# Patient Record
Sex: Female | Born: 1995 | Race: White | Hispanic: No | Marital: Single | Smoking: Never smoker
Health system: Southern US, Community
[De-identification: ages and names within clinical notes are randomized; demographics above are authoritative.]

---
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
## 2015-05-21 ENCOUNTER — Telehealth: Payer: Self-pay | Admitting: Obstetrics and Gynecology

## 2015-05-21 NOTE — Telephone Encounter (Signed)
[facility], Beverlie's  mother called.Marland KitchenMarland KitchenTaylor knot on the left side of her groin area, a lot of pain with intercourse, pain on that side radiating around to er back.

## 2015-05-21 NOTE — Telephone Encounter (Signed)
Needs to be seen

## 2015-05-21 NOTE — Telephone Encounter (Signed)
pls advise

## 2015-05-22 ENCOUNTER — Encounter: Payer: Self-pay | Admitting: Obstetrics and Gynecology

## 2015-05-22 ENCOUNTER — Ambulatory Visit (INDEPENDENT_AMBULATORY_CARE_PROVIDER_SITE_OTHER): Payer: BLUE CROSS/BLUE SHIELD | Admitting: Obstetrics and Gynecology

## 2015-05-22 VITALS — BP 111/67 | HR 66 | Ht 64.0 in | Wt 136.6 lb

## 2015-05-22 DIAGNOSIS — B379 Candidiasis, unspecified: Secondary | ICD-10-CM

## 2015-05-22 DIAGNOSIS — R599 Enlarged lymph nodes, unspecified: Secondary | ICD-10-CM | POA: Diagnosis not present

## 2015-05-22 DIAGNOSIS — R590 Localized enlarged lymph nodes: Secondary | ICD-10-CM

## 2015-05-22 MED ORDER — FLUCONAZOLE 150 MG PO TABS: 150.0000 mg | ORAL_TABLET | Freq: Once | ORAL | Status: DC

## 2015-05-22 NOTE — Telephone Encounter (Signed)
Left message pt needs to be seen

## 2015-05-22 NOTE — Progress Notes (Signed)
Subjective:     Patient ID: Heather Wyatt, female   DOB: 05/09/1996, 19 y.o.   MRN: [id number]  HPI Felt a lump in left groin about 2 weeks ago, not painful and not changing in size. Had a UTI about 5 weeks ago.  Review of Systems Lump in left groin- not painful Occasional left pelvic pains during intercourse, desnies bleeding, same partner.    Objective:   Physical Exam A&O X4 Well groomed female in no distress + enlarged lymph node in left groin- non-tender and non-mobile Pelvic exam: normal external genitalia, vulva, vagina, cervix, uterus and adnexa, WET MOUNT done - results: negative for pathogens, normal epithelial cells, lactobacilli.    Assessment:     monial vaginitis Left groin lymphadenopathy     Plan:     Diflucan  x 1 dose Will recheck lymph node at [facility] that is scheduled in Oct. Or sooner if it increases in size.  Heather Wyatt, CNM

## 2015-05-22 NOTE — Patient Instructions (Signed)
Lymphadenopathy Lymphadenopathy means "disease of the lymph glands." But the term is usually used to describe swollen or enlarged lymph glands, also called lymph nodes. These are the bean-shaped organs found in many locations including the neck, underarm, and groin. Lymph glands are part of the immune system, which fights infections in your body. Lymphadenopathy can occur in just one area of the body, such as the neck, or it can be generalized, with lymph node enlargement in several areas. The nodes found in the neck are the most common sites of lymphadenopathy. CAUSES When your immune system responds to germs (such as viruses or bacteria ), infection-fighting cells and fluid build up. This causes the glands to grow in size. Usually, this is not something to worry about. Sometimes, the glands themselves can become infected and inflamed. This is called lymphadenitis. Enlarged lymph nodes can be caused by many diseases:  Bacterial disease, such as strep throat or a skin infection.  Viral disease, such as a common cold.  Other germs, such as Lyme disease, tuberculosis, or sexually transmitted diseases.  Cancers, such as lymphoma (cancer of the lymphatic system) or leukemia (cancer of the white blood cells).  Inflammatory diseases such as lupus or rheumatoid arthritis.  Reactions to medications. Many of the diseases above are rare, but important. This is why you should see your caregiver if you have lymphadenopathy. SYMPTOMS  Swollen, enlarged lumps in the neck, back of the head, or other locations.  Tenderness.  Warmth or redness of the skin over the lymph nodes.  Fever. DIAGNOSIS Enlarged lymph nodes are often near the source of infection. They can help health care providers diagnose your illness. For instance:  Swollen lymph nodes around the jaw might be caused by an infection in the mouth.  Enlarged glands in the neck often signal a throat infection.  Lymph nodes that are swollen  in more than one area often indicate an illness caused by a virus. Your caregiver will likely know what is causing your lymphadenopathy after listening to your history and examining you. Blood tests, x-rays, or other tests may be needed. If the cause of the enlarged lymph node cannot be found, and it does not go away by itself, then a biopsy may be needed. Your caregiver will discuss this with you. TREATMENT Treatment for your enlarged lymph nodes will depend on the cause. Many times the nodes will shrink to normal size by themselves, with no treatment. Antibiotics or other medicines may be needed for infection. Only take over-the-counter or prescription medicines for pain, discomfort, or fever as directed by your caregiver. HOME CARE INSTRUCTIONS Swollen lymph glands usually return to normal when the underlying medical condition goes away. If they persist, contact your health-care provider. He/she might prescribe antibiotics or other treatments, depending on the diagnosis. Take any medications exactly as prescribed. Keep any follow-up appointments made to check on the condition of your enlarged nodes. SEEK MEDICAL CARE IF:  Swelling lasts for more than two weeks.  You have symptoms such as weight loss, night sweats, fatigue, or fever that does not go away.  The lymph nodes are hard, seem fixed to the skin, or are growing rapidly.  Skin over the lymph nodes is red and inflamed. This could mean there is an infection. SEEK IMMEDIATE MEDICAL CARE IF:  Fluid starts leaking from the area of the enlarged lymph node.  You develop a fever of 102 F (38.9 C) or greater.  Severe pain develops (not necessarily at the site of a  large lymph node).  You develop chest pain or shortness of breath.  You develop worsening abdominal pain. MAKE SURE YOU:  Understand these instructions.  Will watch your condition.  Will get help right away if you are not doing well or get worse. Document Released:  05/24/2008 Document Revised: 12/30/2013 Document Reviewed: 05/24/2008 [facility] Patient Information 2015 [place], [place]. This information is not intended to replace advice given to you by your health care provider. Make sure you discuss any questions you have with your health care provider. Candida Infection A Candida infection (also called yeast, fungus, and Monilia infection) is an overgrowth of yeast that can occur anywhere on the body. A yeast infection commonly occurs in warm, moist body areas. Usually, the infection remains localized but can spread to become a systemic infection. A yeast infection may be a sign of a more severe disease such as diabetes, leukemia, or AIDS. A yeast infection can occur in both men and women. In women, Candida vaginitis is a vaginal infection. It is one of the most common causes of vaginitis. Men usually do not have symptoms or know they have an infection until other problems develop. Men may find out they have a yeast infection because their sex partner has a yeast infection. Uncircumcised men are more likely to get a yeast infection than circumcised men. This is because the uncircumcised glans is not exposed to air and does not remain as dry as that of a circumcised glans. Older adults may develop yeast infections around dentures. CAUSES  Women  Antibiotics.  Steroid medication taken for a long time.  Being overweight (obese).  Diabetes.  Poor immune condition.  Certain serious medical conditions.  Immune suppressive medications for organ transplant patients.  Chemotherapy.  Pregnancy.  Menstruation.  Stress and fatigue.  Intravenous drug use.  Oral contraceptives.  Wearing tight-fitting clothes in the crotch area.  Catching it from a sex partner who has a yeast infection.  Spermicide.  Intravenous, urinary, or other catheters. Men  Catching it from a sex partner who has a yeast infection.  Having oral or anal sex with a person who  has the infection.  Spermicide.  Diabetes.  Antibiotics.  Poor immune system.  Medications that suppress the immune system.  Intravenous drug use.  Intravenous, urinary, or other catheters. SYMPTOMS  Women  Thick, white vaginal discharge.  Vaginal itching.  Redness and swelling in and around the vagina.  Irritation of the lips of the vagina and perineum.  Blisters on the vaginal lips and perineum.  Painful sexual intercourse.  Low blood sugar (hypoglycemia).  Painful urination.  Bladder infections.  Intestinal problems such as constipation, indigestion, bad breath, bloating, increase in gas, diarrhea, or loose stools. Men  Men may develop intestinal problems such as constipation, indigestion, bad breath, bloating, increase in gas, diarrhea, or loose stools.  Dry, cracked skin on the penis with itching or discomfort.  Jock itch.  Dry, flaky skin.  Athlete's foot.  Hypoglycemia. DIAGNOSIS  Women  A history and an exam are performed.  The discharge may be examined under a microscope.  A culture may be taken of the discharge. Men  A history and an exam are performed.  Any discharge from the penis or areas of cracked skin will be looked at under the microscope and cultured.  Stool samples may be cultured. TREATMENT  Women  Vaginal antifungal suppositories and creams.  Medicated creams to decrease irritation and itching on the outside of the vagina.  Warm compresses to  the perineal area to decrease swelling and discomfort.  Oral antifungal medications.  Medicated vaginal suppositories or cream for repeated or recurrent infections.  Wash and dry the irritation areas before applying the cream.  Eating yogurt with Lactobacillus may help with prevention and treatment.  Sometimes painting the vagina with gentian violet solution may help if creams and suppositories do not work. Men  Antifungal creams and oral antifungal  medications.  Sometimes treatment must continue for 30 days after the symptoms go away to prevent recurrence. HOME CARE INSTRUCTIONS  Women  Use cotton underwear and avoid tight-fitting clothing.  Avoid colored, scented toilet paper and deodorant tampons or pads.  Do not douche.  Keep your diabetes under control.  Finish all the prescribed medications.  Keep your skin clean and dry.  Consume milk or yogurt with Lactobacillus-active culture regularly. If you get frequent yeast infections and think that is what the infection is, there are over-the-counter medications that you can get. If the infection does not show healing in 3 days, talk to your caregiver.  Tell your sex partner you have a yeast infection. Your partner may need treatment also, especially if your infection does not clear up or recurs. Men  Keep your skin clean and dry.  Keep your diabetes under control.  Finish all prescribed medications.  Tell your sex partner that you have a yeast infection so he or she can be treated if necessary. SEEK MEDICAL CARE IF:   Your symptoms do not clear up or worsen in one week after treatment.  You have an oral temperature above 102 F (38.9 C).  You have trouble swallowing or eating for a prolonged time.  You develop blisters on and around your vagina.  You develop vaginal bleeding and it is not your menstrual period.  You develop abdominal pain.  You develop intestinal problems as mentioned above.  You get weak or light-headed.  You have painful or increased urination.  You have pain during sexual intercourse. MAKE SURE YOU:   Understand these instructions.  Will watch your condition.  Will get help right away if you are not doing well or get worse. Document Released: 09/22/2004 Document Revised: 12/30/2013 Document Reviewed: 01/04/2010 [facility] Patient Information 2015 [place], [place]. This information is not intended to replace advice given to you by your  health care provider. Make sure you discuss any questions you have with your health care provider.

## 2015-06-26 ENCOUNTER — Encounter: Payer: Self-pay | Admitting: Obstetrics and Gynecology

## 2015-06-26 ENCOUNTER — Ambulatory Visit: Payer: BLUE CROSS/BLUE SHIELD | Admitting: Obstetrics and Gynecology

## 2015-06-26 VITALS — BP 100/56 | HR 70 | Ht 64.0 in | Wt 135.1 lb

## 2015-06-26 DIAGNOSIS — Z01419 Encounter for gynecological examination (general) (routine) without abnormal findings: Secondary | ICD-10-CM

## 2015-06-26 NOTE — Progress Notes (Incomplete)
SUBJECTIVE:  19 y.o. female for annual well woman exam and checkup. Follow up on left inguinal LN - patient reports it is unchanged, nontender Hx of yeast infections: Denies any vaginal itching or discharge Sexually active, monogamous, no new partners  Contraception: Lo Lo Estrin, wants to continue it, no side effects. Cycles are regular, 28 day cycle with 4 days of minor bleeding. Exercises 2x/week, healthy diet Does self breast exams every few months   Current Outpatient Prescriptions  Medication Sig Dispense Refill  . norethindrone-ethinyl estradiol (JUNEL FE,GILDESS FE,LOESTRIN FE) 1-20 MG-MCG tablet Take 1 tablet by mouth daily.    . fluconazole (DIFLUCAN) 150 MG tablet Take 1 tablet (150 mg total) by mouth once. Can take additional dose three days later if symptoms persist (Patient not taking: Reported on 06/26/2015) 1 tablet 3   No current facility-administered medications for this visit.   Allergies: Review of patient's allergies indicates no known allergies.  Patient's last menstrual period was 06/07/2015.  ROS:  Feeling well. No dyspnea or chest pain on exertion.  No abdominal pain, change in bowel habits, black or bloody stools.  No urinary tract symptoms.  GYN ROS: normal menses, no abnormal bleeding, pelvic pain or discharge, no breast pain or new or enlarging lumps on self exam, no side effects of hormonal medications, no vaginal bleeding, no discharge or pelvic pain, cyclic withdrawal menses only. No neurological complaints.  OBJECTIVE:  The patient appears well, alert, oriented x 3, in no distress. BP 100/56 mmHg  Pulse 70  Ht 5\' 4"  (1.626 m)  Wt 135 lb 1.6 oz (61.281 kg)  BMI 23.18 kg/m2  LMP 06/07/2015 ENT normal.  Neck supple. No adenopathy or thyromegaly. PERLA. Lungs are clear, good air entry, no wheezes, rhonchi or rales. S1 and S2 normal, no murmurs, regular rate and rhythm. Abdomen soft without tenderness, guarding, mass or organomegaly. Extremities show no  edema, normal peripheral pulses. Neurological is normal, no focal findings.  BREAST EXAM: breasts appear normal, no suspicious masses, no skin or nipple changes or axillary nodes  PELVIC EXAM: normal external genitalia, vulva, external RV.  Cervix not examined Left inguinal LN <1cm, mobile, nontender. Stable since last exam.  ASSESSMENT:  Well woman exam, 19yo No contraindication to continue use of oral contraceptives Sexually active on OCP  PLAN:  Additional lab tests per orders - GC swab done Return annually or PRN Counseled on self breast exams, regular exercise, healthy eating habits.  Continue monitoring left inguinal LN for changes. Refill of Lo Lo Estrin sent   Doreene NestElena Amandalee Lacap, PA-S

## 2015-06-26 NOTE — Progress Notes (Unsigned)
SUBJECTIVE:  19 y.o. female for annual well woman exam and checkup. Follow up on left inguinal LN - patient reports it is unchanged, nontender Hx of yeast infections: Denies any vaginal itching or discharge Sexually active, monogamous, no new partners  Contraception: Lo Lo Estrin, wants to continue it, no side effects. Cycles are regular, 28 day cycle with 4 days of minor bleeding. Exercises 2x/week, healthy diet Does self breast exams every few months   Current Outpatient Prescriptions  Medication Sig Dispense Refill  . norethindrone-ethinyl estradiol (JUNEL FE,GILDESS FE,LOESTRIN FE) 1-20 MG-MCG tablet Take 1 tablet by mouth daily.    . fluconazole (DIFLUCAN) 150 MG tablet Take 1 tablet (150 mg total) by mouth once. Can take additional dose three days later if symptoms persist (Patient not taking: Reported on 06/26/2015) 1 tablet 3   No current facility-administered medications for this visit.   Allergies: Review of patient's allergies indicates no known allergies.  Patient's last menstrual period was 06/07/2015.  ROS:  Feeling well. No dyspnea or chest pain on exertion.  No abdominal pain, change in bowel habits, black or bloody stools.  No urinary tract symptoms.  GYN ROS: normal menses, no abnormal bleeding, pelvic pain or discharge, no breast pain or new or enlarging lumps on self exam, no side effects of hormonal medications, no vaginal bleeding, no discharge or pelvic pain, cyclic withdrawal menses only. No neurological complaints.  OBJECTIVE:  The patient appears well, alert, oriented x 3, in no distress. BP 100/56 mmHg  Pulse 70  Ht 5\' 4"  (1.626 m)  Wt 135 lb 1.6 oz (61.281 kg)  BMI 23.18 kg/m2  LMP 06/07/2015 ENT normal.  Neck supple. No adenopathy or thyromegaly. PERLA. Lungs are clear, good air entry, no wheezes, rhonchi or rales. S1 and S2 normal, no murmurs, regular rate and rhythm. Abdomen soft without tenderness, guarding, mass or organomegaly. Extremities show no  edema, normal peripheral pulses. Neurological is normal, no focal findings.  BREAST EXAM: breasts appear normal, no suspicious masses, no skin or nipple changes or axillary nodes  PELVIC EXAM: normal external genitalia, vulva, external RV.  Cervix not examined Left inguinal LN <1cm, mobile, nontender. Stable since last exam.  ASSESSMENT:  Well woman exam, 19yo No contraindication to continue use of oral contraceptives Sexually active on OCP  PLAN:  Additional lab tests per orders - GC swab done Return annually or PRN Counseled on self breast exams, regular exercise, healthy eating habits.  Continue monitoring left inguinal LN for changes. Refill of Lo Lo Estrin sent   Doreene NestElena Klaus, PA-S Melody CampbellBurr, CNM

## 2015-06-26 NOTE — Patient Instructions (Addendum)
1. Normal pelvic exam today 2. Gonorrhea/chlamydia swab done today, patient will be called with results 3. Continue self breast exams, exercise, healthy eating 4. Refill of Lo Lo Estrin sent to your pharmacy 5. Follow up in 1 year for annual exam  Thank you for enrolling in MyChart. Please follow the instructions below to securely access your online medical record. MyChart allows you to send messages to your doctor, view your test results, manage appointments, and more.   How Do I Sign Up? 1. In your Internet browser, go to [employer] and enter [email or web address]. 2. Click on the Sign Up Now link in the Sign In box. You will see the New Member Sign Up page. 3. Enter your MyChart Access Code exactly as it appears below. You will not need to use this code after you've completed the sign-up process. If you do not sign up before the expiration date, you must request a new code.  MyChart Access Code: [email or web address] Expires: 08/07/2015  3:06 AM  4. Enter your Social Security Number ([id number]) and Date of Birth (mm/dd/yyyy) as indicated and click Submit. You will be taken to the next sign-up page. 5. Create a MyChart ID. This will be your MyChart login ID and cannot be changed, so think of one that is secure and easy to remember. 6. Create a MyChart password. You can change your password at any time. 7. Enter your Password Reset Question and Answer. This can be used at a later time if you forget your password.  8. Enter your e-mail address. You will receive e-mail notification when new information is available in MyChart. 9. Click Sign Up. You can now view your medical record.   Additional Information Remember, MyChart is NOT to be used for urgent needs. For medical emergencies, dial 911.

## 2015-07-06 ENCOUNTER — Encounter: Payer: Self-pay | Admitting: Obstetrics and Gynecology

## 2015-10-15 ENCOUNTER — Other Ambulatory Visit: Payer: Self-pay | Admitting: *Deleted

## 2015-10-19 ENCOUNTER — Other Ambulatory Visit: Payer: Self-pay | Admitting: *Deleted

## 2015-10-19 MED ORDER — NORETHIN ACE-ETH ESTRAD-FE 1.5-30 MG-MCG PO TABS: 1.0000 | ORAL_TABLET | Freq: Every day | ORAL | Status: DC

## 2016-05-03 ENCOUNTER — Other Ambulatory Visit: Payer: Self-pay | Admitting: *Deleted

## 2016-05-03 ENCOUNTER — Telehealth: Payer: Self-pay | Admitting: Obstetrics and Gynecology

## 2016-05-03 MED ORDER — NORETHIN-ETH ESTRAD-FE BIPHAS 1 MG-10 MCG / 10 MCG PO TABS: 1.0000 | ORAL_TABLET | Freq: Every day | ORAL | 11 refills | Status: DC

## 2016-05-03 NOTE — Telephone Encounter (Signed)
Sent in Lo Loestrin Fe 1/10 to [facility], left detailed message for pt

## 2016-05-03 NOTE — Telephone Encounter (Signed)
PT CALLED AND HSE WOULD LIKE TO GO BACK TO HER OLD BIRTH CONTROL SHE SAID THE LOLO, SHE WOULD ALSO LIKE HER PH[facility]AC[facility]. PT WOULD LIKE A CALL WHEN DONE

## 2016-07-01 ENCOUNTER — Ambulatory Visit (INDEPENDENT_AMBULATORY_CARE_PROVIDER_SITE_OTHER): Payer: 59 | Admitting: Obstetrics and Gynecology

## 2016-07-01 ENCOUNTER — Encounter: Payer: Self-pay | Admitting: Obstetrics and Gynecology

## 2016-07-01 VITALS — BP 120/72 | HR 78 | Ht 64.0 in | Wt 146.4 lb

## 2016-07-01 DIAGNOSIS — Z01419 Encounter for gynecological examination (general) (routine) without abnormal findings: Secondary | ICD-10-CM

## 2016-07-01 NOTE — Patient Instructions (Signed)
Place annual gynecologic exam patient instructions here.

## 2016-07-01 NOTE — Progress Notes (Signed)
Subjective:     Heather Wyatt is a 20 y.o. female and is here for a comprehensive physical exam. The patient reports no problems. Advised that new OCP is working much better and her periods are much lighter.   Needs Flu Vaccine  Social smoker and uses ETOH socially as well. Works at [employer] and attends [employer]. Transferring to [employer] in January. Denies vaginal discharge, breakthrough bleeding, painful sex or any other complaints.  Social History   Social History  . Marital status: Single    Spouse name: N/A  . Number of children: N/A  . Years of education: N/A   Occupational History  . Not on file.   Social History Main Topics  . Smoking status: Never Smoker  . Smokeless tobacco: Never Used  . Alcohol use No  . Drug use: No  . Sexual activity: Yes    Birth control/ protection: Pill   Other Topics Concern  . Not on file   Social History Narrative  . No narrative on file   Health Maintenance  Topic Date Due  . CHLAMYDIA SCREENING  04/22/2011  . HIV Screening  04/22/2011  . TETANUS/TDAP  04/22/2015  . INFLUENZA VACCINE  03/29/2016    The following portions of the patient's history were reviewed and updated as appropriate: allergies, current medications, past family history, past medical history, past social history, past surgical history and problem list.  Review of Systems Pertinent items noted in HPI and remainder of comprehensive ROS otherwise negative.   Objective:    BP 120/72   Pulse 78   Ht 5\' 4"  (1.626 m)   Wt 146 lb 6.4 oz (66.4 kg)   LMP 06/04/2016   BMI 25.13 kg/m  General appearance: alert, cooperative and appears older than stated age Head: Normocephalic, without obvious abnormality, atraumatic Throat: lips, mucosa, and tongue normal; teeth and gums normal Neck: no adenopathy, no carotid bruit, no JVD, supple, symmetrical, trachea midline and thyroid not enlarged, symmetric, no tenderness/mass/nodules Back: symmetric, no curvature. ROM normal. No CVA  tenderness. Lungs: clear to auscultation bilaterally Breasts: normal appearance, no masses or tenderness Heart: regular rate and rhythm, S1, S2 normal, no murmur, click, rub or gallop Abdomen: soft, non-tender; bowel sounds normal; no masses,  no organomegaly Pelvic: cervix normal in appearance, external genitalia normal, no adnexal masses or tenderness, no cervical motion tenderness, rectovaginal septum normal, uterus normal size, shape, and consistency and vagina normal without discharge Extremities: extremities normal, atraumatic, no cyanosis or edema Pulses: 2+ and symmetric Skin: Skin color, texture, turgor normal. No rashes or lesions Neurologic: Grossly normal    Assessment:    Healthy female exam.    OCP user    Plan:  Flu vaccine given. Follow up in 1 year or sooner if needed. Patient changing her annual to December so it falls over winter break for school. May need OCP refill prior.   See After Visit Summary for Counseling Recommendations

## 2016-11-11 DIAGNOSIS — J019 Acute sinusitis, unspecified: Secondary | ICD-10-CM | POA: Diagnosis not present

## 2016-11-11 DIAGNOSIS — J029 Acute pharyngitis, unspecified: Secondary | ICD-10-CM | POA: Diagnosis not present

## 2017-02-14 DIAGNOSIS — L508 Other urticaria: Secondary | ICD-10-CM | POA: Diagnosis not present

## 2017-03-14 DIAGNOSIS — L508 Other urticaria: Secondary | ICD-10-CM | POA: Diagnosis not present

## 2017-04-07 ENCOUNTER — Other Ambulatory Visit: Payer: Self-pay | Admitting: Obstetrics and Gynecology

## 2017-04-07 ENCOUNTER — Telehealth: Payer: Self-pay | Admitting: Obstetrics and Gynecology

## 2017-08-16 ENCOUNTER — Encounter: Payer: Self-pay | Admitting: Obstetrics and Gynecology

## 2017-08-16 ENCOUNTER — Encounter: Payer: 59 | Admitting: Obstetrics and Gynecology

## 2017-08-16 ENCOUNTER — Other Ambulatory Visit: Payer: Self-pay | Admitting: Obstetrics and Gynecology

## 2017-08-16 ENCOUNTER — Ambulatory Visit (INDEPENDENT_AMBULATORY_CARE_PROVIDER_SITE_OTHER): Payer: 59 | Admitting: Obstetrics and Gynecology

## 2017-08-16 VITALS — BP 123/74 | HR 75 | Ht 64.0 in | Wt 142.5 lb

## 2017-08-16 DIAGNOSIS — Z01411 Encounter for gynecological examination (general) (routine) with abnormal findings: Secondary | ICD-10-CM | POA: Diagnosis not present

## 2017-08-16 DIAGNOSIS — N9089 Other specified noninflammatory disorders of vulva and perineum: Secondary | ICD-10-CM | POA: Diagnosis not present

## 2017-08-16 DIAGNOSIS — Z01419 Encounter for gynecological examination (general) (routine) without abnormal findings: Secondary | ICD-10-CM | POA: Diagnosis not present

## 2017-08-16 DIAGNOSIS — R300 Dysuria: Secondary | ICD-10-CM | POA: Diagnosis not present

## 2017-08-16 LAB — POCT URINALYSIS DIPSTICK
BILIRUBIN UA: NEGATIVE
GLUCOSE UA: NEGATIVE
KETONES UA: NEGATIVE
Leukocytes, UA: NEGATIVE
Nitrite, UA: POSITIVE
Protein, UA: NEGATIVE
RBC UA: NEGATIVE
SPEC GRAV UA: 1.015 (ref 1.010–1.025)
Urobilinogen, UA: 0.2 E.U./dL
pH, UA: 6 (ref 5.0–8.0)

## 2017-08-16 MED ORDER — LIDOCAINE HCL 2 % EX GEL: 1.0000 "application " | CUTANEOUS | 2 refills | Status: AC | PRN

## 2017-08-16 MED ORDER — NITROFURANTOIN MONOHYD MACRO 100 MG PO CAPS: 100.0000 mg | ORAL_CAPSULE | Freq: Two times a day (BID) | ORAL | 1 refills | Status: AC

## 2017-08-16 MED ORDER — NORETHIN-ETH ESTRAD-FE BIPHAS 1 MG-10 MCG / 10 MCG PO TABS: 1.0000 | ORAL_TABLET | Freq: Every day | ORAL | 6 refills | Status: DC

## 2017-08-16 NOTE — Patient Instructions (Signed)
Preventive Care 18-39 Years, Female Preventive care refers to lifestyle choices and visits with your health care provider that can promote health and wellness. What does preventive care include?  A yearly physical exam. This is also called an annual well check.  Dental exams once or twice a year.  Routine eye exams. Ask your health care provider how often you should have your eyes checked.  Personal lifestyle choices, including: ? Daily care of your teeth and gums. ? Regular physical activity. ? Eating a healthy diet. ? Avoiding tobacco and drug use. ? Limiting alcohol use. ? Practicing safe sex. ? Taking vitamin and mineral supplements as recommended by your health care provider. What happens during an annual well check? The services and screenings done by your health care provider during your annual well check will depend on your age, overall health, lifestyle risk factors, and family history of disease. Counseling Your health care provider may ask you questions about your:  Alcohol use.  Tobacco use.  Drug use.  Emotional well-being.  Home and relationship well-being.  Sexual activity.  Eating habits.  Work and work Statistician.  Method of birth control.  Menstrual cycle.  Pregnancy history.  Screening You may have the following tests or measurements:  Height, weight, and BMI.  Diabetes screening. This is done by checking your blood sugar (glucose) after you have not eaten for a while (fasting).  Blood pressure.  Lipid and cholesterol levels. These may be checked every 5 years starting at age 38.  Skin check.  Hepatitis C blood test.  Hepatitis B blood test.  Sexually transmitted disease (STD) testing.  BRCA-related cancer screening. This may be done if you have a family history of breast, ovarian, tubal, or peritoneal cancers.  Pelvic exam and Pap test. This may be done every 3 years starting at age 38. Starting at age 30, this may be done  every 5 years if you have a Pap test in combination with an HPV test.  Discuss your test results, treatment options, and if necessary, the need for more tests with your health care provider. Vaccines Your health care provider may recommend certain vaccines, such as:  Influenza vaccine. This is recommended every year.  Tetanus, diphtheria, and acellular pertussis (Tdap, Td) vaccine. You may need a Td booster every 10 years.  Varicella vaccine. You may need this if you have not been vaccinated.  HPV vaccine. If you are 39 or younger, you may need three doses over 6 months.  Measles, mumps, and rubella (MMR) vaccine. You may need at least one dose of MMR. You may also need a second dose.  Pneumococcal 13-valent conjugate (PCV13) vaccine. You may need this if you have certain conditions and were not previously vaccinated.  Pneumococcal polysaccharide (PPSV23) vaccine. You may need one or two doses if you smoke cigarettes or if you have certain conditions.  Meningococcal vaccine. One dose is recommended if you are age 68-21 years and a first-year college student living in a residence hall, or if you have one of several medical conditions. You may also need additional booster doses.  Hepatitis A vaccine. You may need this if you have certain conditions or if you travel or work in places where you may be exposed to hepatitis A.  Hepatitis B vaccine. You may need this if you have certain conditions or if you travel or work in places where you may be exposed to hepatitis B.  Haemophilus influenzae type b (Hib) vaccine. You may need this  if you have certain risk factors.  Talk to your health care provider about which screenings and vaccines you need and how often you need them. This information is not intended to replace advice given to you by your health care provider. Make sure you discuss any questions you have with your health care provider. Document Released: 10/11/2001 Document Revised:  05/04/2016 Document Reviewed: 06/16/2015 Elsevier Interactive Patient Education  2018 Elsevier Inc.  

## 2017-08-16 NOTE — Progress Notes (Signed)
ANNUAL PREVENTATIVE CARE GYN  ENCOUNTER NOTE  Subjective:       Heather Wyatt is a single white 21 y.o. G0P0000 female here for a routine annual gynecologic exam. 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 Exercising 3-4 days a week. Needs flu vaccine.  Current complaints: burning at top of vulva x 1 week with urination increased. Does report new sexual partner for 3 months.    Gynecologic History Patient's last menstrual period was 07/08/2017. Contraception: OCP (estrogen/progesterone)   Obstetric History OB History  Gravida Para Term Preterm AB Living  0 0 0 0 0 0  SAB TAB Ectopic Multiple Live Births  0 0 0 0 0        History reviewed. No pertinent past medical history.  History reviewed. No pertinent surgical history.  Current Outpatient Medications on File Prior to Visit  Medication Sig Dispense Refill  . LO LOESTRIN FE 1 MG-10 MCG / 10 MCG tablet TAKE 1 TABLET BY MOUTH DAILY 28 tablet 6   No current facility-administered medications on file prior to visit.     No Known Allergies  Social History   Socioeconomic History  . Marital status: Single    Spouse name: Not on file  . Number of children: Not on file  . Years of education: Not on file  . Highest education level: Not on file  Social Needs  . Financial resource strain: Not on file  . Food insecurity - worry: Not on file  . Food insecurity - inability: Not on file  . Transportation needs - medical: Not on file  . Transportation needs - non-medical: Not on file  Occupational History  . Not on file  Tobacco Use  . Smoking status: Never Smoker  . Smokeless tobacco: Never Used  Substance and Sexual Activity  . Alcohol use: No  . Drug use: No  . Sexual activity: Yes    Birth control/protection: Pill  Other Topics Concern  . Not on file  Social History Narrative  . Not on file    History reviewed. No pertinent family history.  The following portions of the patient's history were  reviewed and updated as appropriate: allergies, current medications, past family history, past medical history, past social history, past surgical history and problem list.  Review of Systems ROS Review of Systems - General ROS: negative for - chills, fatigue, fever, hot flashes, night sweats, weight gain or weight loss Psychological ROS: negative for - anxiety, decreased libido, depression, mood swings, physical abuse or sexual abuse Ophthalmic ROS: negative for - blurry vision, eye pain or loss of vision ENT ROS: negative for - headaches, hearing change, visual changes or vocal changes Allergy and Immunology ROS: negative for - hives, itchy/watery eyes or seasonal allergies Hematological and Lymphatic ROS: negative for - bleeding problems, bruising, swollen lymph nodes or weight loss Endocrine ROS: negative for - galactorrhea, hair pattern changes, hot flashes, malaise/lethargy, mood swings, palpitations, polydipsia/polyuria, skin changes, temperature intolerance or unexpected weight changes Breast ROS: negative for - new or changing breast lumps or nipple discharge Respiratory ROS: negative for - cough or shortness of breath Cardiovascular ROS: negative for - chest pain, irregular heartbeat, palpitations or shortness of breath Gastrointestinal ROS: no abdominal pain, change in bowel habits, or black or bloody stools Genito-Urinary ROS: no dysuria, trouble voiding, or hematuria Musculoskeletal ROS: negative for - joint pain or joint stiffness Neurological ROS: negative for - bowel and bladder control changes Dermatological ROS: negative for  rash and skin lesion changes   Objective:   BP 123/74   Pulse 75   Ht 5\' 4"  (1.626 m)   Wt 142 lb 8 oz (64.6 kg)   LMP 07/08/2017   BMI 24.46 kg/m  CONSTITUTIONAL: Well-developed, well-nourished female in no acute distress.  PSYCHIATRIC: Normal mood and affect. Normal behavior. Normal judgment and thought content. NEUROLGIC: Alert and oriented to  person, place, and time. Normal muscle tone coordination. No cranial nerve deficit noted. HENT:  Normocephalic, atraumatic, External right and left ear normal. Oropharynx is clear and moist EYES: Conjunctivae and EOM are normal. No scleral icterus.  NECK: Normal range of motion, supple, no masses.  Normal thyroid.  SKIN: Skin is warm and dry. No rash noted. Not diaphoretic. No erythema. No pallor. CARDIOVASCULAR: Normal heart rate noted, regular rhythm, no murmur. RESPIRATORY: Clear to auscultation bilaterally. Effort and breath sounds normal, no problems with respiration noted. BREASTS: Symmetric in size. No masses, skin changes, nipple drainage, or lymphadenopathy. ABDOMEN: Soft, normal bowel sounds, no distention noted.  No tenderness, rebound or guarding.  BLADDER: Normal PELVIC:  External Genitalia: Normal except small red flat lesion on right side of clitoral hood.  BUS: Normal  Vagina: Normal  Cervix: Normal except increased yellow discharge, friable with pap.  Uterus: Normal  Adnexa: Normal  RV: Not indicated  MUSCULOSKELETAL: Normal range of motion. No tenderness.  No cyanosis, clubbing, or edema.  2+ distal pulses. LYMPHATIC: No Axillary, Supraclavicular, or Inguinal Adenopathy.    Assessment:   Annual gynecologic examination 21 y.o. Contraception: OCP (estrogen/progesterone) Normal BMI Problem List Items Addressed This Visit    None    Visit Diagnoses    Burning with urination    -  Primary   Relevant Orders   POCT urinalysis dipstick (Completed)   Urine Culture   Encounter for gynecological examination without abnormal finding        vulvar lesion UTI   Plan:  Pap: Pap, Reflex if ASCUS and GC/CT NAAT  HSV culture and blood levels obtained. Will follow up accordingly. Lidocaine gel prescribed and urine sent for culture   Routine preventative health maintenance measures emphasized: Exercise/Diet/Weight control, Tobacco Warnings, Alcohol/Substance use risks,  Stress Management, Peer Pressure Issues and Safe Sex  Return to Clinic - 1 Year   Melody [place], [place]

## 2017-08-17 LAB — RPR: RPR: NONREACTIVE

## 2017-08-17 LAB — CYTOLOGY - PAP

## 2017-08-17 LAB — HSV 1 AND 2 IGM ABS, INDIRECT: HSV 1 IgM: <1:10 {titer}

## 2017-08-17 LAB — HIV ANTIBODY (ROUTINE TESTING W REFLEX): HIV SCREEN 4TH GENERATION: NONREACTIVE

## 2017-08-18 ENCOUNTER — Telehealth: Payer: Self-pay | Admitting: Obstetrics and Gynecology

## 2017-08-18 ENCOUNTER — Encounter: Payer: Self-pay | Admitting: Obstetrics and Gynecology

## 2017-08-18 LAB — HERPES SIMPLEX VIRUS CULTURE

## 2017-08-18 LAB — URINE CULTURE: ORGANISM ID, BACTERIA: NO GROWTH

## 2017-08-18 NOTE — Telephone Encounter (Signed)
The patient called and stated that she would like to know her results from her most recent labs. The patient also stated that she was concerned not hearing anything yet because she was told the results would be in. No other information was disclosed. Please advise.

## 2017-08-21 NOTE — Telephone Encounter (Signed)
Pt aware [facility] has not reviewed labs. Pt aware [facility] will get the message on 08/24/2017.

## 2017-08-22 ENCOUNTER — Encounter: Payer: Self-pay | Admitting: Obstetrics and Gynecology

## 2017-09-10 ENCOUNTER — Encounter: Payer: Self-pay | Admitting: Obstetrics and Gynecology

## 2017-10-02 ENCOUNTER — Other Ambulatory Visit: Payer: Self-pay | Admitting: *Deleted

## 2017-10-02 ENCOUNTER — Encounter: Payer: Self-pay | Admitting: Obstetrics and Gynecology

## 2017-10-02 MED ORDER — NORETHIN-ETH ESTRAD-FE BIPHAS 1 MG-10 MCG / 10 MCG PO TABS: 1.0000 | ORAL_TABLET | Freq: Every day | ORAL | 6 refills | Status: AC

## 2017-11-16 NOTE — Telephone Encounter (Signed)
Error

## 2017-12-18 ENCOUNTER — Encounter: Payer: Self-pay | Admitting: Obstetrics and Gynecology

## 2017-12-19 ENCOUNTER — Encounter: Payer: Self-pay | Admitting: Obstetrics and Gynecology

## 2018-08-17 ENCOUNTER — Encounter: Payer: 59 | Admitting: Obstetrics and Gynecology
